# Patient Record
Sex: Female | Born: 1950 | Hispanic: Yes | Marital: Single
Health system: Southern US, Community
[De-identification: ages and names within clinical notes are randomized; demographics above are authoritative.]

---

## 2004-08-26 ENCOUNTER — Ambulatory Visit: Payer: Self-pay | Admitting: Otolaryngology

## 2005-11-25 ENCOUNTER — Ambulatory Visit: Payer: Self-pay | Admitting: Psychiatry

## 2006-11-21 ENCOUNTER — Ambulatory Visit: Payer: Self-pay | Admitting: Unknown Physician Specialty

## 2007-10-16 ENCOUNTER — Ambulatory Visit: Payer: Self-pay

## 2009-03-10 ENCOUNTER — Ambulatory Visit: Payer: Self-pay

## 2010-04-20 ENCOUNTER — Ambulatory Visit: Payer: Self-pay | Admitting: Family Medicine

## 2010-04-23 ENCOUNTER — Ambulatory Visit: Payer: Self-pay | Admitting: Family Medicine

## 2012-01-17 ENCOUNTER — Emergency Department: Payer: Self-pay | Admitting: Emergency Medicine

## 2012-01-17 LAB — URINALYSIS, COMPLETE
Bilirubin,UR: NEGATIVE
Blood: NEGATIVE
Glucose,UR: NEGATIVE mg/dL (ref 0–75)
Ketone: NEGATIVE
Leukocyte Esterase: NEGATIVE
Nitrite: NEGATIVE
RBC,UR: 1 /HPF (ref 0–5)
Squamous Epithelial: 1
WBC UR: 1 /HPF (ref 0–5)

## 2012-01-17 LAB — CBC
HCT: 36.2 % (ref 35.0–47.0)
HGB: 11.9 g/dL — ABNORMAL LOW (ref 12.0–16.0)
MCH: 26.1 pg (ref 26.0–34.0)
MCHC: 32.7 g/dL (ref 32.0–36.0)
WBC: 8 10*3/uL (ref 3.6–11.0)

## 2012-01-17 LAB — COMPREHENSIVE METABOLIC PANEL
Albumin: 3.4 g/dL (ref 3.4–5.0)
Anion Gap: 9 (ref 7–16)
Calcium, Total: 9.6 mg/dL (ref 8.5–10.1)
Co2: 29 mmol/L (ref 21–32)
EGFR (African American): >60
EGFR (Non-African Amer.): >60
Glucose: 127 mg/dL — ABNORMAL HIGH (ref 65–99)
Osmolality: 280 (ref 275–301)
Potassium: 3.7 mmol/L (ref 3.5–5.1)
SGPT (ALT): 20 U/L (ref 12–78)
Sodium: 141 mmol/L (ref 136–145)

## 2012-01-18 ENCOUNTER — Ambulatory Visit: Payer: Self-pay | Admitting: Oncology

## 2012-01-18 LAB — APTT: Activated PTT: 33.4 secs (ref 23.6–35.9)

## 2012-01-18 LAB — CBC CANCER CENTER
Basophil #: 0 x10 3/mm (ref 0.0–0.1)
Basophil %: 0.4 %
Eosinophil #: 0.2 x10 3/mm (ref 0.0–0.7)
Eosinophil %: 2.7 %
HGB: 12.3 g/dL (ref 12.0–16.0)
Lymphocyte #: 1.4 x10 3/mm (ref 1.0–3.6)
MCH: 25.9 pg — ABNORMAL LOW (ref 26.0–34.0)
MCV: 81 fL (ref 80–100)
Monocyte #: 0.5 x10 3/mm (ref 0.2–0.9)
Monocyte %: 6.5 %
Neutrophil %: 72 %
Platelet: 255 x10 3/mm (ref 150–440)
RBC: 4.74 10*6/uL (ref 3.80–5.20)
WBC: 7.9 x10 3/mm (ref 3.6–11.0)

## 2012-01-18 LAB — COMPREHENSIVE METABOLIC PANEL
Albumin: 3.3 g/dL — ABNORMAL LOW (ref 3.4–5.0)
BUN: 8 mg/dL (ref 7–18)
Bilirubin,Total: 0.6 mg/dL (ref 0.2–1.0)
Chloride: 103 mmol/L (ref 98–107)
Co2: 27 mmol/L (ref 21–32)
Creatinine: 1.1 mg/dL (ref 0.60–1.30)
EGFR (African American): >60
EGFR (Non-African Amer.): 54 — ABNORMAL LOW
Osmolality: 280 (ref 275–301)
SGOT(AST): 21 U/L (ref 15–37)
Sodium: 140 mmol/L (ref 136–145)
Total Protein: 8.5 g/dL — ABNORMAL HIGH (ref 6.4–8.2)

## 2012-01-18 LAB — PROTIME-INR: Prothrombin Time: 16.1 secs — ABNORMAL HIGH (ref 11.5–14.7)

## 2012-01-19 ENCOUNTER — Ambulatory Visit: Payer: Self-pay | Admitting: Oncology

## 2012-01-27 ENCOUNTER — Ambulatory Visit: Payer: Self-pay | Admitting: Oncology

## 2012-01-31 ENCOUNTER — Ambulatory Visit: Payer: Self-pay | Admitting: Oncology

## 2012-01-31 LAB — PROTIME-INR: Prothrombin Time: 15.6 secs — ABNORMAL HIGH (ref 11.5–14.7)

## 2012-01-31 LAB — APTT: Activated PTT: 32.4 secs (ref 23.6–35.9)

## 2012-02-08 LAB — CBC CANCER CENTER
Basophil #: 0 x10 3/mm (ref 0.0–0.1)
Basophil %: 0.2 %
Eosinophil %: 0.2 %
HCT: 40 % (ref 35.0–47.0)
Lymphocyte #: 1 x10 3/mm (ref 1.0–3.6)
MCH: 25.5 pg — ABNORMAL LOW (ref 26.0–34.0)
MCHC: 31.8 g/dL — ABNORMAL LOW (ref 32.0–36.0)
MCV: 80 fL (ref 80–100)
Monocyte %: 6.3 %
Neutrophil %: 85.5 %
Platelet: 319 x10 3/mm (ref 150–440)
RBC: 4.99 10*6/uL (ref 3.80–5.20)
RDW: 14.2 % (ref 11.5–14.5)

## 2012-02-15 LAB — CBC CANCER CENTER
Basophil #: 0 x10 3/mm (ref 0.0–0.1)
Basophil %: 0.1 %
Eosinophil #: 0 x10 3/mm (ref 0.0–0.7)
HCT: 39.4 % (ref 35.0–47.0)
HGB: 12.4 g/dL (ref 12.0–16.0)
Lymphocyte #: 0.9 x10 3/mm — ABNORMAL LOW (ref 1.0–3.6)
Lymphocyte %: 5 %
MCHC: 31.6 g/dL — ABNORMAL LOW (ref 32.0–36.0)
MCV: 80 fL (ref 80–100)
Monocyte %: 4.8 %
Neutrophil #: 16.7 x10 3/mm — ABNORMAL HIGH (ref 1.4–6.5)
Platelet: 250 x10 3/mm (ref 150–440)
RBC: 4.93 10*6/uL (ref 3.80–5.20)
RDW: 15.1 % — ABNORMAL HIGH (ref 11.5–14.5)

## 2012-02-26 ENCOUNTER — Ambulatory Visit: Payer: Self-pay | Admitting: Oncology

## 2012-02-28 LAB — PATHOLOGY REPORT

## 2012-12-14 IMAGING — CT CT HEAD WITHOUT AND WITH CONTRAST
1 of 3 series · 10 of 30 positions shown, 13 images · IV contrast (isovue)
Comparison: none

REASON FOR EXAM: Lung Mass
COMMENTS:

PROCEDURE:     KACA - OTSILE D LALA SABOKONE/BUDDHIKA  - January 23, 2012  [DATE]
RESULT:     Comparison:  MRI of the brain 11/25/2005
TECHNIQUE: Multiple axial images from the foramen magnum to the vertex were
obtained with and without 50 mL Isovue 300 intravenous contrast.

[Series 4: soft tissue with · axial · 0.39mm/px · z∈[-74,+46]mm · 10 of 30 slices shown, 13 images]
[im 3/30  brain]
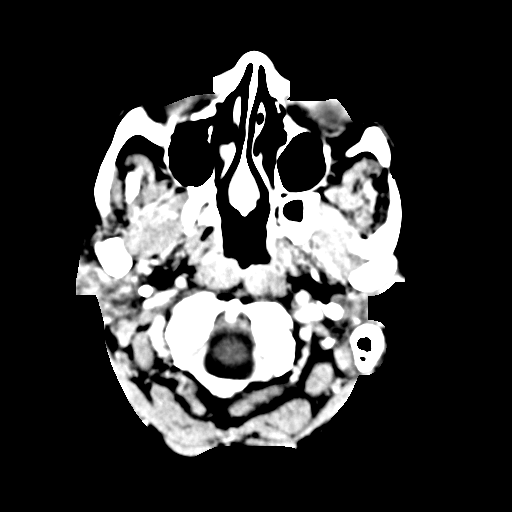
[im 3/30  bone]
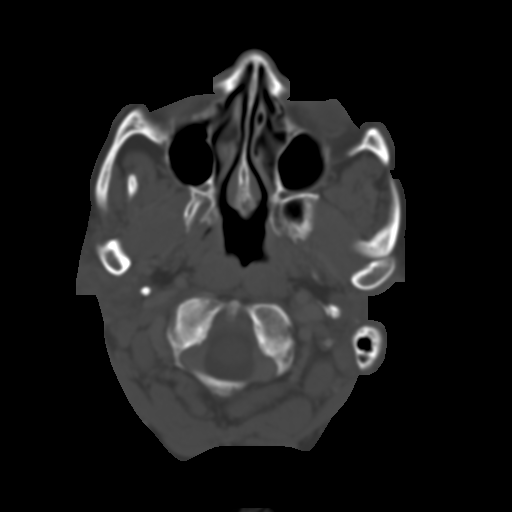
[im 6/30  brain]
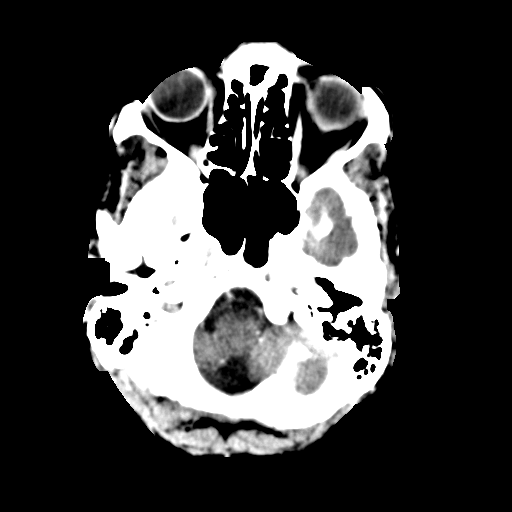
[im 8/30  brain]
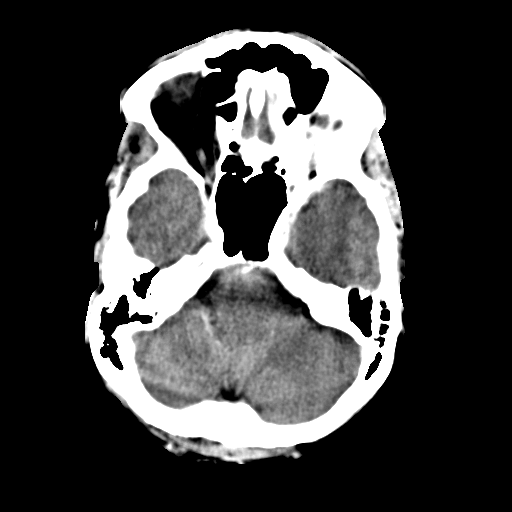
[im 11/30  brain]
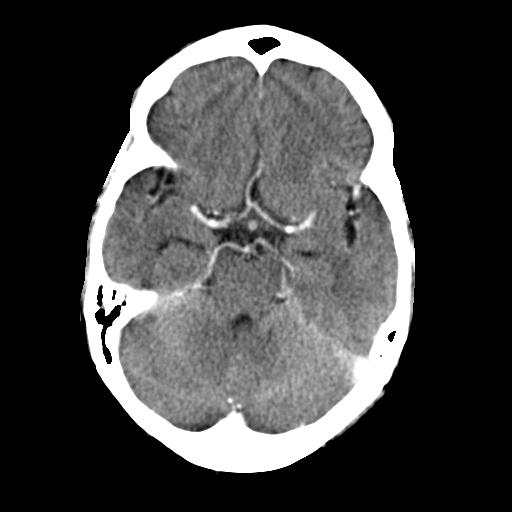
[im 14/30  brain]
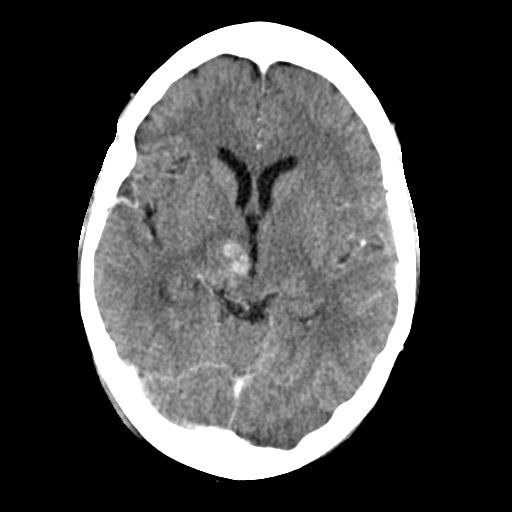
[im 14/30  bone]
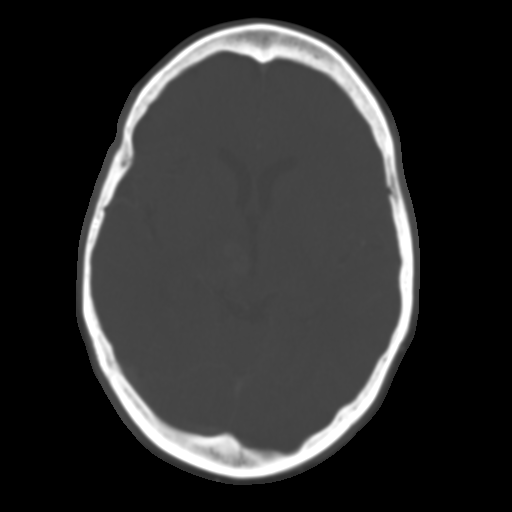
[im 16/30  brain]
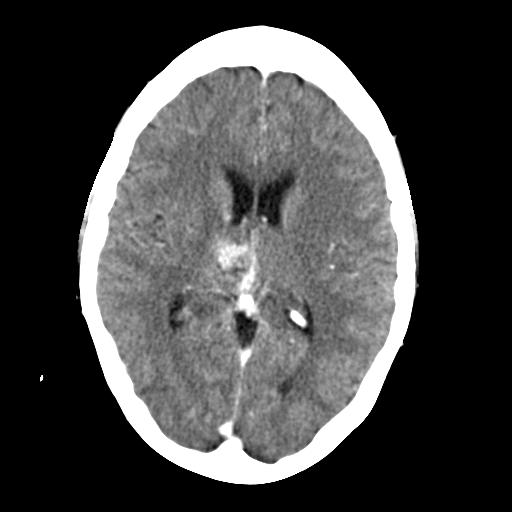
[im 19/30  brain]
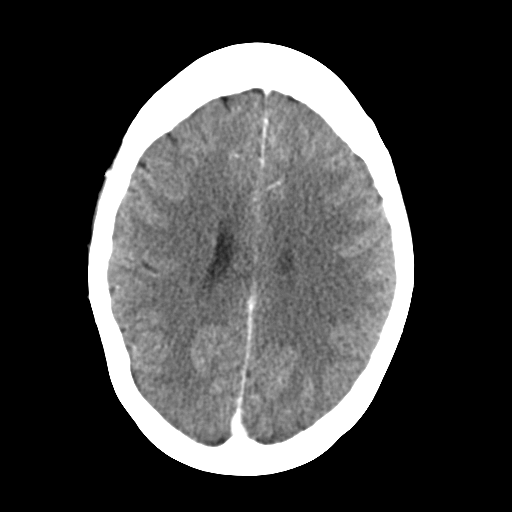
[im 22/30  brain]
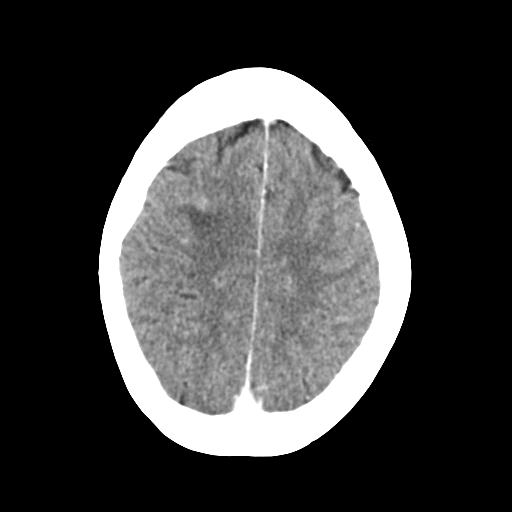
[im 24/30  brain]
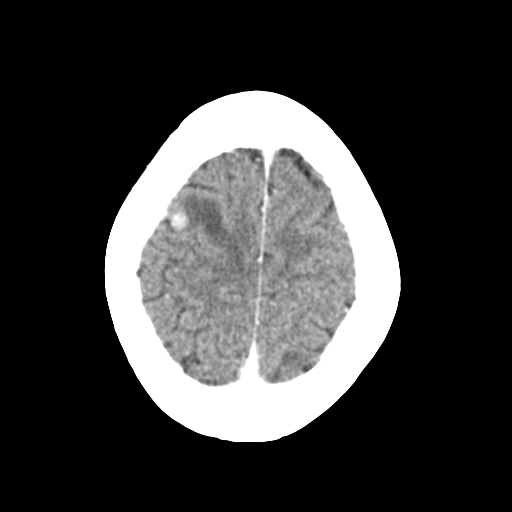
[im 24/30  bone]
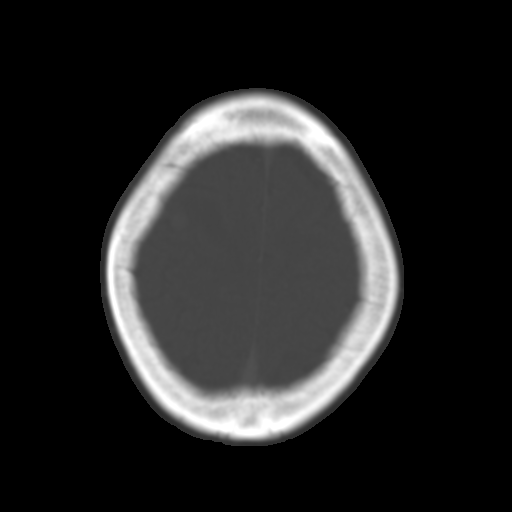
[im 27/30  brain]
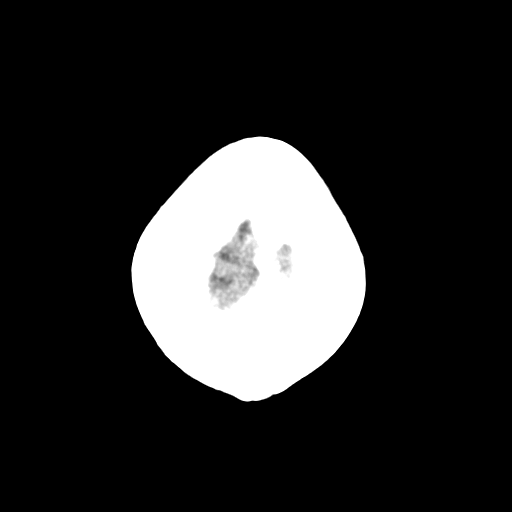

[10 of 30 positions shown; findings below may reference images not displayed]

FINDINGS: There is an ill-defined area of hypoattenuation within the right lobe and
thalamus. This demonstrates heterogeneous enhancement and is concerning for
a site of metastatic disease. It measures 1.8 x 1.7 cm. There is a small
focus of round enhancement in the cortex of the right frontal lobe with a
small area of associated subcortical hypoattenuation is likely represents
vasogenic edema. The round focus of enhancement measures 7 mm in diameter.

The ventricles are normal in size. There is no midline shift. No extra-axial
fluid collection for intracranial hemorrhage seen.

The osseous structures are unremarkable.
IMPRESSION: There are enhancing lesions in the right lobe of the thalamus and the right
frontal lobe cortex which are concerning for metastatic disease given the
patient's history.

## 2012-12-22 IMAGING — CR DG CHEST 1V PORT
1 series · 1 of 1 positions shown · non-contrast
Comparison: none

REASON FOR EXAM: left lung mass biopsy-expiratory view
COMMENTS:

PROCEDURE:     DXR - DXR PORTABLE CHEST SINGLE VIEW  - January 31, 2012  [DATE]
RESULT:     Comparison: None

[ap]
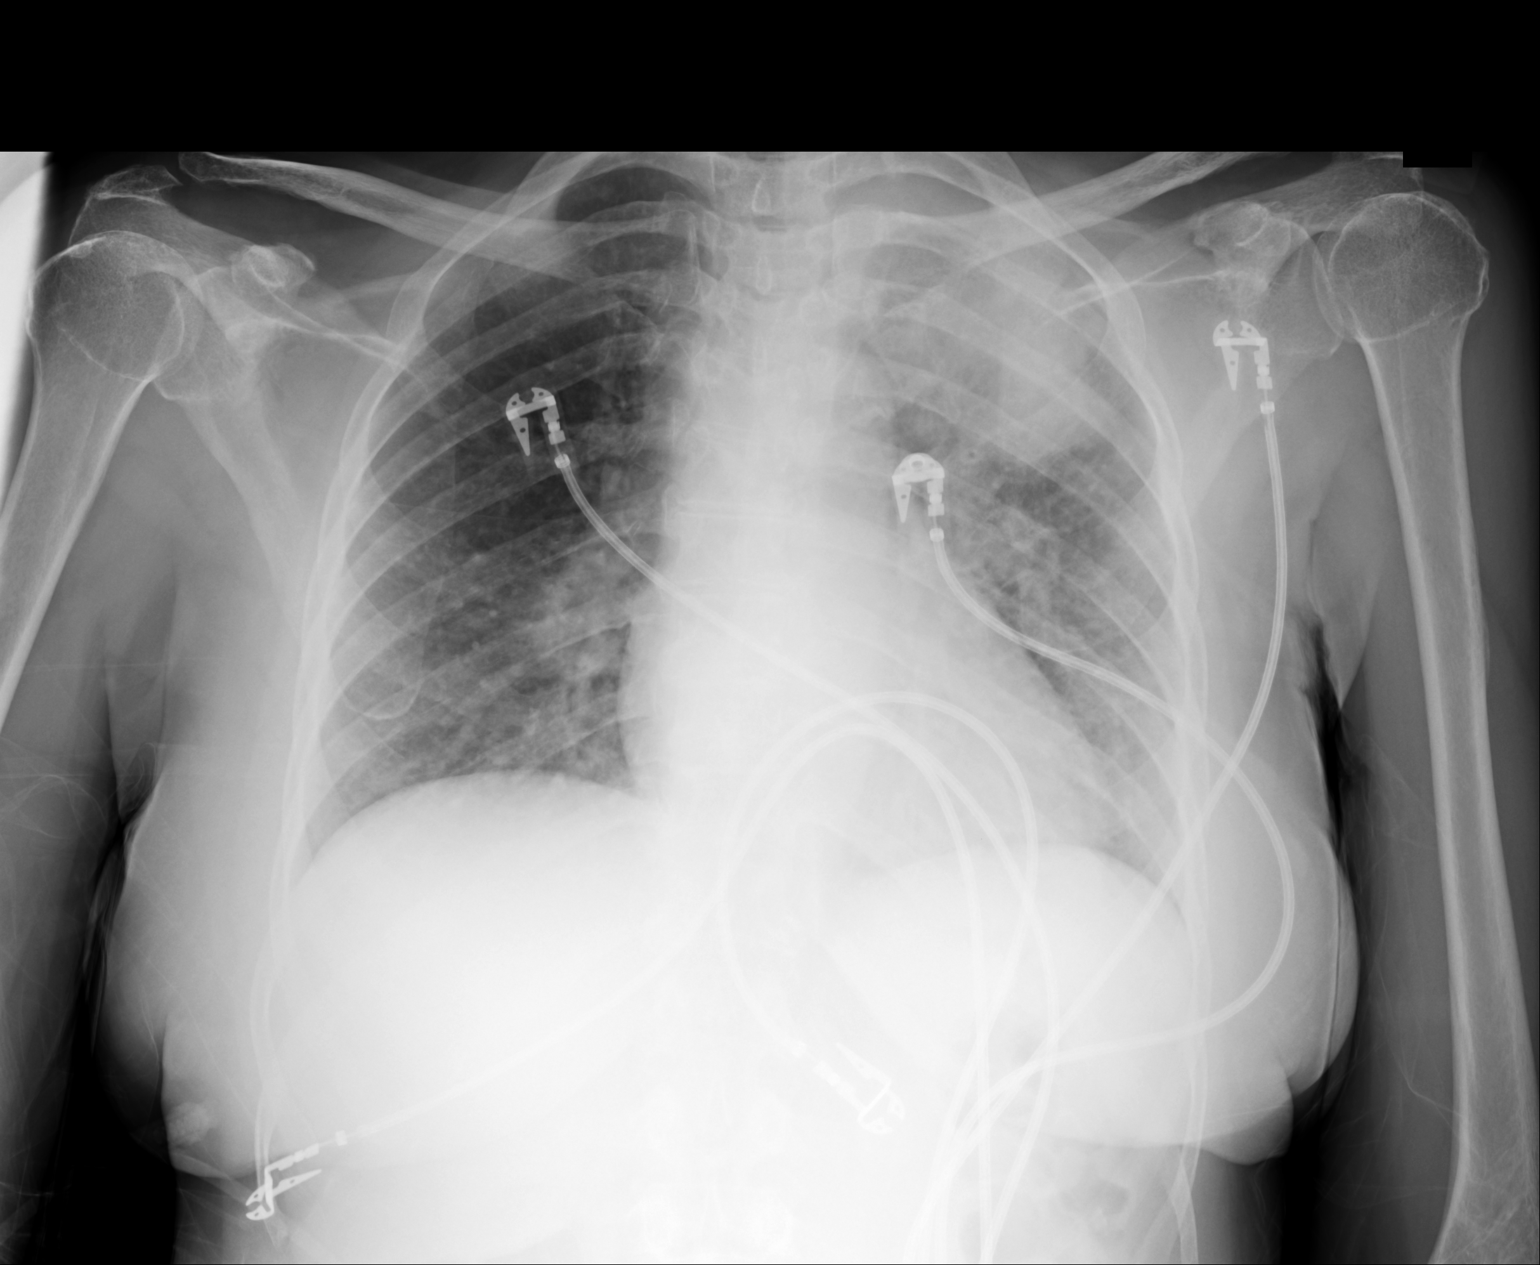

[1 of 1 positions shown; findings below may reference images not displayed]

FINDINGS: Single portable AP chest radiograph is provided. There is a left upper lobe
pulmonary mass. There is no pneumothorax. Normal the cardiac silhouette. The
osseous structures are unremarkable.
IMPRESSION: No left pneumothorax status post percutaneous left lung biopsy.

[REDACTED]

## 2013-04-28 DEATH — deceased

## 2014-07-15 NOTE — Consult Note (Signed)
History of Present Illness:   Reason for Consult I am asked to see this patient in consultation at the request of Dr. Doylene Canninghoksi for evaluation of newly diagnosed brain lesions.    HPI   Ms. Arleta Creekieves is a very pleasant 64 year old woman who presented with a persistent cough and headaches and was found to have a large left upper lobe mass s/p biopsy 11/5 and CT scan of the head with a right frontal and right thalamic enhancing lesion suspicious for metastatic disease.  She denies any focal weakness, parasthesias, or seizure activity.  She complains of a constant headache, occasional dizziness, and general fatigue.  PFSH:   Comments Accompanied to clinic with her daughter.  History of working in a [employer] years ago where she may have been exposed to some toxic chemicals.  She denies tobacco use.  She enjoys salsa and [employer] dancing.   NURSING NOTES: *CC Vital Signs Flowsheet:   06-Nov-13 14:51    Temp Temperature: 97.6    Pulse Pulse: 97    Respirations Respirations: 18    SBP SBP: 95    DBP DBP: 68    Pain Scale (0-10): 0    Current Weight (kg) (kg): 56    Height (cm) centimeters: 165    BSA (m2): 1.6   Physical Exam:   Physical Exam Patient is awake, alert, oriented with appropriate conversation and is quite talkative and eager to discuss her situation. Eyes are open. EOMI. Face symmetric. She appears to have grossly normal tone and bulk in her major myotomes with symmetric strength throughout.     Thyroid Problems:    No Known Allergies:     Tylenol 325 mg oral tablet: 2 tab(s) orally every 4 hours, As Needed- for Pain , Active, 0, None   levothyroxine 75 mcg (0.075 mg) oral tablet: 1 tab(s) orally once a day (in the morning) on an empty stomach, Active, 0, None  Assessment and Plan:  Impression:   Patient with newly diagnosed lung mass and multiple enhancing brain lesions consistent in appearance with metastatic disease.  I reviewed the imaging with the  patient and her daughter.  I discussed that I did not feel that open surgery was an option in that there were numerous lesions and the largest of these was in the thalamus and would have significant risk with surgical resection for a permanent deficit.  I feel that her best option is to consider whole brain radiation.  If this is done and one or two of the lesions were to progress or not respond, we could then consider stereotactic radiosurgery boost or perhaps open surgical resection depending on the lesion and the location.    Plan:   Will plan to refer to radiation oncology here.on pathology results.patient on decadron 4 mg bid to see if may assist with the headaches.  Electronic Signatures: Michelene GardenerSasaki-Adams, Zykera Abella M (MD)  (Signed 06-Nov-13 15:33)  Authored: HISTORY OF PRESENT ILLNESS, PFSH, NURSING NOTES, PE, PAST MEDICAL HISTORY, ALLERGIES, HOME MEDICATIONS, ASSESSMENT AND PLAN   Last Updated: 06-Nov-13 15:33 by Michelene GardenerSasaki-Adams, Mouna Yager M (MD)

## 2014-07-15 NOTE — Consult Note (Signed)
Reason for Visit: This 64 year old Female patient presents to the clinic for initial evaluation of  Brain metastases .   Referred by Dr. Doylene Canninghoksi.  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 She was found to have abnormal chest x-ray followed by CT scan showing large left lung mass. PET CT confirmed avid uptake in the left lung mass as well as mediastinal and hilar adenopathy as well as hepatic metastasis. MRI of the brain showed a large lesion centered near the thalamus as well as multiple smaller sites of presumed metastatic disease. She is undergone a fine-needle aspiration CT-guided by radiology and tissue diagnosis is pending. She's been seen by neurosurgery based on the location of her brain tumor near the thalamus and multiple other lesions not considered an operative candidate at this time. She seen today for consideration of palliative whole brain radiation. She is doing fairly well. She does have headaches. No focal neurologic deficits are reported. She's been given a prescription for steroids and we'll start that tonight. She specifically declines any change in visual fields any focal neurologic deficits or other areas of pain. 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  Past Hx:    Thyroid Problems:   Past, Family and Social History:   Past Medical History positive    Endocrine hypothyroidism    Family History noncontributory    Social History noncontributory    Additional Past Medical  and Surgical History No smoking history. Accompanied by her daughter today.   Allergies:   No Known Allergies:   Home Meds:  Home Medications: Medication Instructions Status  Tylenol 325 mg oral tablet 2 tab(s) orally every 4 hours, As Needed- for Pain  Active  levothyroxine 75 mcg (0.075 mg) oral tablet 1 tab(s) orally once a day (in the morning) on an empty stomach Active  Decadron 4 mg 2 tab(s) orally once a day (at bedtime) on 02/01/12. then take 1 tab in the morning and 1 tablet at bedtime quantity #4 no refills Active   Review of Systems:   General negative    Performance Status (ECOG) 0    Skin negative    Breast negative    Ophthalmologic negative    ENMT negative    Respiratory and Thorax see HPI    Cardiovascular negative    Gastrointestinal negative    Genitourinary negative    Musculoskeletal negative    Neurological see HPI    Psychiatric negative    Endocrine negative    Allergic/Immunologic negative    Review of Systems   as per nurse's notes except for shortness of breath and headachesPatient denies any weight loss, fatigue, weakness, fever, chills or night sweats. Patient denies any loss of vision, blurred vision. Patient denies any ringing  of the ears or hearing loss. No irregular heartbeat. Patient denies heart murmur or history of fainting. Patient denies any chest pain or pain radiating to her upper extremities. Patient denies any shortness of breath, difficulty breathing at night, cough or hemoptysis. Patient denies any swelling  in the lower legs. Patient denies any nausea vomiting, vomiting of blood, or coffee ground material in the vomitus. Patient denies any stomach pain. Patient states has had normal bowel movements no significant constipation or diarrhea. Patient denies any dysuria, hematuria or significant nocturia. Patient denies any problems walking, swelling in the joints or loss of balance. Patient denies any skin changes, loss of hair or  loss of weight. Patient denies any excessive worrying or anxiety or significant depression. Patient denies any problems with insomnia. Patient denies excessive thirst, polyuria, polydipsia. Patient denies any swollen glands, patient denies easy bruising or easy bleeding. Patient denies any recent infections, allergies or URI. Patient "s visual fields have not changed significantly in recent time.  Nursing Notes:  Nursing Vital Signs and Chemo Nursing Nursing Notes: *CC Vital Signs Flowsheet:   06-Nov-13 14:51   Temp Temperature 97.6   Pulse Pulse 97   Respirations Respirations 18   SBP SBP 95   DBP DBP 68   Pain Scale (0-10)  0   Current Weight (kg) (kg) 56   Height (cm) centimeters 165   BSA (m2) 1.6   Physical Exam:  General/Skin/HEENT:   General normal    Skin normal    Eyes normal    ENMT normal    Head and Neck normal    Additional PE Well-developed well-nourished female in NAD. Cranial nerves II through XII are grossly intact. HEENT reveals PERRLA, fundi not visualized. Crude visual fields are within normal range. Motor sensory N. DTR levels are equal and symmetric in the upper and lower extremities bilaterally. Range of motion of her lower extremities does not elicit pain. Deep palpation of her lumbar spine does not elicit pain. Lungs are clear to A&P cardiac examination shows regular rate and rhythm. Proprioception is intact.   Breasts/Resp/CV/GI/GU:   Respiratory and Thorax normal    Cardiovascular normal    Gastrointestinal normal    Genitourinary normal   MS/Neuro/Psych/Lymph:   Musculoskeletal normal    Neurological normal    Lymphatics normal   Assessment and Plan:  Impression:   stage IV non-small cell lung cancer pending tissue diagnosis in 64 year old female with widespread brain metastasis.  Plan:   at the stomach to go ahead with whole brain radiation therapy. We'll treat up the course of radiation 3000 cGy in 10 fractions. We can was come back  and treated with stereotactic radiosurgery the large found that metastasis should this progress over time. I concur with Dr. Pernell Dupre assessment is she is a nonsurgical candidate based on her widespread metastatic disease as well as location of the tumor in her found that region. Risks and benefits of whole brain radiation therapy were discussed with the patient and her daughter and both seem to [place] treatment plan well. Loss of hair, alteration blood counts, possible cognitive loss were all explained in detail to the patient. I have set her up for CT simulation tomorrow. Case was discussed personally with Dr. Doylene Canning.  I would like to take this opportunity to thank you for allowing me to continue to participate in this patient's care.  CC Referral:   cc: Dr. Leim Fabry   Electronic Signatures: Rushie Chestnut, Gordy Councilman (MD)  (Signed [phone] 16:25)  Authored: HPI, Diagnosis, Past Hx, PFSH, Allergies, Home Meds, ROS, Nursing Notes, Physical Exam, Encounter Assessment and Plan, CC Referring Physician   Last Updated: 06-Nov-13 16:25 by Rebeca Alert (MD)

## 2018-02-05 ENCOUNTER — Other Ambulatory Visit: Payer: Self-pay | Admitting: Family Medicine

## 2018-02-05 DIAGNOSIS — Z1231 Encounter for screening mammogram for malignant neoplasm of breast: Secondary | ICD-10-CM
